# Patient Record
Sex: Female | Born: 1995 | Race: Black or African American | Hispanic: No | Marital: Single | Smoking: Current some day smoker
Health system: Southern US, Community
[De-identification: ages and names within clinical notes are randomized; demographics above are authoritative.]

## PROBLEM LIST (undated history)

## (undated) DIAGNOSIS — K8020 Calculus of gallbladder without cholecystitis without obstruction: Secondary | ICD-10-CM

## (undated) DIAGNOSIS — E079 Disorder of thyroid, unspecified: Secondary | ICD-10-CM

## (undated) DIAGNOSIS — I10 Essential (primary) hypertension: Secondary | ICD-10-CM

## (undated) HISTORY — PX: BREAST SURGERY: SHX581

## (undated) HISTORY — DX: Disorder of thyroid, unspecified: E07.9

## (undated) HISTORY — DX: Essential (primary) hypertension: I10

---

## 2020-01-14 ENCOUNTER — Encounter (HOSPITAL_COMMUNITY): Payer: Self-pay | Admitting: *Deleted

## 2020-01-14 ENCOUNTER — Other Ambulatory Visit: Payer: Self-pay

## 2020-01-14 ENCOUNTER — Ambulatory Visit: Payer: Self-pay

## 2020-01-14 ENCOUNTER — Emergency Department (HOSPITAL_COMMUNITY)
Admission: EM | Admit: 2020-01-14 | Discharge: 2020-01-14 | Disposition: A | Discharge status: Home / Self Care | Payer: 59 | Attending: Emergency Medicine | Admitting: Emergency Medicine

## 2020-01-14 DIAGNOSIS — I10 Essential (primary) hypertension: Secondary | ICD-10-CM | POA: Diagnosis not present

## 2020-01-14 DIAGNOSIS — R1013 Epigastric pain: Secondary | ICD-10-CM | POA: Diagnosis present

## 2020-01-14 DIAGNOSIS — R1011 Right upper quadrant pain: Secondary | ICD-10-CM | POA: Diagnosis not present

## 2020-01-14 LAB — CBC
HCT: 38.6 % (ref 36.0–46.0)
Hemoglobin: 12.1 g/dL (ref 12.0–15.0)
MCH: 25.2 pg — ABNORMAL LOW (ref 26.0–34.0)
MCHC: 31.3 g/dL (ref 30.0–36.0)
MCV: 80.2 fL (ref 80.0–100.0)
Platelets: 429 10*3/uL — ABNORMAL HIGH (ref 150–400)
RBC: 4.81 MIL/uL (ref 3.87–5.11)
RDW: 15.3 % (ref 11.5–15.5)
WBC: 9.1 10*3/uL (ref 4.0–10.5)
nRBC: 0 % (ref 0.0–0.2)

## 2020-01-14 LAB — URINALYSIS, ROUTINE W REFLEX MICROSCOPIC
Bilirubin Urine: NEGATIVE
Glucose, UA: NEGATIVE mg/dL
Hgb urine dipstick: NEGATIVE
Ketones, ur: 5 mg/dL — AB
Leukocytes,Ua: NEGATIVE
Nitrite: NEGATIVE
Protein, ur: NEGATIVE mg/dL
Specific Gravity, Urine: 1.024 (ref 1.005–1.030)
pH: 5 (ref 5.0–8.0)

## 2020-01-14 LAB — COMPREHENSIVE METABOLIC PANEL
ALT: 40 U/L (ref 0–44)
AST: 31 U/L (ref 15–41)
Albumin: 4.1 g/dL (ref 3.5–5.0)
Alkaline Phosphatase: 53 U/L (ref 38–126)
Anion gap: 12 (ref 5–15)
BUN: 9 mg/dL (ref 6–20)
CO2: 22 mmol/L (ref 22–32)
Calcium: 9.5 mg/dL (ref 8.9–10.3)
Chloride: 104 mmol/L (ref 98–111)
Creatinine, Ser: 0.82 mg/dL (ref 0.44–1.00)
GFR calc Af Amer: >60 mL/min (ref >60)
GFR calc non Af Amer: >60 mL/min (ref >60)
Glucose, Bld: 102 mg/dL — ABNORMAL HIGH (ref 70–99)
Potassium: 3.6 mmol/L (ref 3.5–5.1)
Sodium: 138 mmol/L (ref 135–145)
Total Bilirubin: 0.7 mg/dL (ref 0.3–1.2)
Total Protein: 7.5 g/dL (ref 6.5–8.1)

## 2020-01-14 LAB — I-STAT BETA HCG BLOOD, ED (MC, WL, AP ONLY): I-stat hCG, quantitative: <5 m[IU]/mL (ref <5)

## 2020-01-14 LAB — LIPASE, BLOOD: Lipase: 20 U/L (ref 11–51)

## 2020-01-14 NOTE — Discharge Instructions (Signed)
Your lab work today is normal.  Gallbladder and liver enzymes are normal.  Follow-up with your primary care doctor for ultrasound of your gallbladder.  Continue Tylenol Motrin for pain as needed.  Please return if symptoms worsen or fever develops.

## 2020-01-14 NOTE — ED Provider Notes (Signed)
[facility] EMERGENCY DEPARTMENT Provider Note   CSN: [id number] Arrival date & time: 01/14/20  3810     History Chief Complaint  Patient presents with  . Abdominal Pain    Mercedes Mcgee is a 24 y.o. female.  The history is provided by the patient.  Abdominal Pain Pain location:  Epigastric and RUQ Pain quality: aching   Pain radiates to:  Does not radiate Pain severity:  Mild Onset quality:  Gradual Timing:  Intermittent Progression:  Waxing and waning Chronicity:  New Context: eating   Relieved by:  Nothing Worsened by:  Nothing Associated symptoms: constipation   Associated symptoms: no chest pain, no chills, no cough, no diarrhea, no dysuria, no fever, no hematuria, no melena, no nausea, no shortness of breath, no sore throat, no vaginal bleeding, no vaginal discharge and no vomiting   Risk factors: no alcohol abuse and has not had multiple surgeries        Past Medical History:  Diagnosis Date  . Hypertension   . Thyroid disease     There are no problems to display for this patient.   Past Surgical History:  Procedure Laterality Date  . BREAST SURGERY       OB History   No obstetric history on file.     No family history on file.  Social History   Tobacco Use  . Smoking status: Not on file  Substance Use Topics  . Alcohol use: Not on file  . Drug use: Not on file    Home Medications Prior to Admission medications   Not on File    Allergies    Patient has no known allergies.  Review of Systems   Review of Systems  Constitutional: Negative for chills and fever.  HENT: Negative for ear pain and sore throat.   Eyes: Negative for pain and visual disturbance.  Respiratory: Negative for cough and shortness of breath.   Cardiovascular: Negative for chest pain and palpitations.  Gastrointestinal: Positive for abdominal pain and constipation. Negative for diarrhea, melena, nausea and vomiting.  Genitourinary: Negative for  dysuria, hematuria, vaginal bleeding and vaginal discharge.  Musculoskeletal: Negative for arthralgias and back pain.  Skin: Negative for color change and rash.  Neurological: Negative for seizures and syncope.  All other systems reviewed and are negative.   Physical Exam Updated Vital Signs  ED Triage Vitals  Enc Vitals Group     BP 01/14/20 0610 118/88     Pulse Rate 01/14/20 0610 88     Resp 01/14/20 0610 16     Temp 01/14/20 0610 98.2 F (36.8 C)     Temp Source 01/14/20 0610 Oral     SpO2 01/14/20 0610 100 %     Weight 01/14/20 0610 284 lb (128.8 kg)     Height 01/14/20 0610 5\' 4"  (1.626 m)     Head Circumference --      Peak Flow --      Pain Score 01/14/20 0614 7     Pain Loc --      Pain Edu? --      Excl. in GC? --     Physical Exam Vitals and nursing note reviewed.  Constitutional:      General: She is not in acute distress.    Appearance: She is well-developed. She is not ill-appearing.  HENT:     Head: Normocephalic and atraumatic.     Mouth/Throat:     Mouth: Mucous membranes are moist.  Eyes:  Extraocular Movements: Extraocular movements intact.     Conjunctiva/sclera: Conjunctivae normal.     Pupils: Pupils are equal, round, and reactive to light.  Cardiovascular:     Rate and Rhythm: Normal rate and regular rhythm.     Heart sounds: Normal heart sounds. No murmur heard.   Pulmonary:     Effort: Pulmonary effort is normal. No respiratory distress.     Breath sounds: Normal breath sounds.  Abdominal:     General: Abdomen is flat.     Palpations: Abdomen is soft.     Tenderness: There is abdominal tenderness in the right upper quadrant and epigastric area. There is no guarding or rebound. Negative signs include Murphy's sign, Rovsing's sign and McBurney's sign.     Hernia: No hernia is present.  Musculoskeletal:     Cervical back: Neck supple.  Skin:    General: Skin is warm and dry.     Capillary Refill: Capillary refill takes less than 2  seconds.  Neurological:     General: No focal deficit present.     Mental Status: She is alert.  Psychiatric:        Mood and Affect: Mood normal.     ED Results / Procedures / Treatments   Labs (all labs ordered are listed, but only abnormal results are displayed) Labs Reviewed  COMPREHENSIVE METABOLIC PANEL - Abnormal; Notable for the following components:      Result Value   Glucose, Bld 102 (*)    All other components within normal limits  CBC - Abnormal; Notable for the following components:   MCH 25.2 (*)    Platelets 429 (*)    All other components within normal limits  URINALYSIS, ROUTINE W REFLEX MICROSCOPIC - Abnormal; Notable for the following components:   APPearance HAZY (*)    Ketones, ur 5 (*)    All other components within normal limits  LIPASE, BLOOD  I-STAT BETA HCG BLOOD, ED ([facility], [facility], [facility] ONLY)    EKG None  Radiology No results found.  Procedures Procedures (including critical care time)  Medications Ordered in ED Medications - No data to display  ED Course  I have reviewed the triage vital signs and the nursing notes.  Pertinent labs & imaging results that were available during my care of the patient were reviewed by me and considered in my medical decision making (see chart for details).    MDM Rules/Calculators/A&P                          Mercedes Mcgee is a 24 year old female with history of hypertension who presents to the ED with right upper abdominal pain on and off for the past week or so.  Normal vitals, no fever.  Denies any nausea, vomiting.  Has been constipated.  Feels like possibly gas pain.  May be worse with eating.  Denies any hematuria or history of kidney stones.  No vaginal discharge or bleeding.  Pregnancy test negative.  Has some tenderness in the epigastric and right upper quadrant on abdominal exam.  No peritonitis.  No obvious Murphy sign.  Suspect likely gastritis versus hepatobiliary process such as gallstone/biliary colic,  less likely cholecystitis given normal vitals, no white count.  Could be constipated/gas pains.  Will await CMP, lipase.  Anticipate follow-up with PCP if lab work is unremarkable.  Gallbladder and liver enzymes normal.  Lipase normal.  Doubt pancreatitis.  Doubt cholecystitis.  Could be biliary colic but  overall patient is comfortable.  Given return precautions.  Recommend follow-up with primary care doctor and discharged from the ED in good condition.  This chart was dictated using voice recognition software.  Despite best efforts to proofread,  errors can occur which can change the documentation meaning.    Final Clinical Impression(s) / ED Diagnoses Final diagnoses:  Right upper quadrant abdominal pain    Rx / DC Orders ED Discharge Orders    None       Virgina Norfolk, DO 01/14/20 9480

## 2020-01-14 NOTE — ED Triage Notes (Signed)
To ED for eval of right upper abd pain for the past week. States pain is worse when she is laying or sitting. Pain is relieved when moving. States she is constipated but is passing gas. Also burping more than her normal. No nausea or vomiting. No OTC meds used.

## 2020-01-18 ENCOUNTER — Encounter (HOSPITAL_BASED_OUTPATIENT_CLINIC_OR_DEPARTMENT_OTHER): Payer: Self-pay | Admitting: Emergency Medicine

## 2020-01-18 ENCOUNTER — Other Ambulatory Visit: Payer: Self-pay

## 2020-01-18 ENCOUNTER — Emergency Department (HOSPITAL_BASED_OUTPATIENT_CLINIC_OR_DEPARTMENT_OTHER): Payer: 59

## 2020-01-18 DIAGNOSIS — K8050 Calculus of bile duct without cholangitis or cholecystitis without obstruction: Secondary | ICD-10-CM | POA: Insufficient documentation

## 2020-01-18 DIAGNOSIS — K8020 Calculus of gallbladder without cholecystitis without obstruction: Secondary | ICD-10-CM | POA: Diagnosis not present

## 2020-01-18 DIAGNOSIS — F1210 Cannabis abuse, uncomplicated: Secondary | ICD-10-CM | POA: Diagnosis not present

## 2020-01-18 DIAGNOSIS — I10 Essential (primary) hypertension: Secondary | ICD-10-CM | POA: Insufficient documentation

## 2020-01-18 DIAGNOSIS — R1011 Right upper quadrant pain: Secondary | ICD-10-CM | POA: Diagnosis present

## 2020-01-18 LAB — URINALYSIS, ROUTINE W REFLEX MICROSCOPIC
Bilirubin Urine: NEGATIVE
Glucose, UA: NEGATIVE mg/dL
Ketones, ur: 15 mg/dL — AB
Leukocytes,Ua: NEGATIVE
Nitrite: NEGATIVE
Protein, ur: NEGATIVE mg/dL
Specific Gravity, Urine: 1.025 (ref 1.005–1.030)
pH: 6 (ref 5.0–8.0)

## 2020-01-18 LAB — COMPREHENSIVE METABOLIC PANEL
ALT: 21 U/L (ref 0–44)
AST: 16 U/L (ref 15–41)
Albumin: 4.2 g/dL (ref 3.5–5.0)
Alkaline Phosphatase: 50 U/L (ref 38–126)
Anion gap: 10 (ref 5–15)
BUN: 9 mg/dL (ref 6–20)
CO2: 22 mmol/L (ref 22–32)
Calcium: 9.3 mg/dL (ref 8.9–10.3)
Chloride: 104 mmol/L (ref 98–111)
Creatinine, Ser: 0.71 mg/dL (ref 0.44–1.00)
GFR calc Af Amer: >60 mL/min (ref >60)
GFR calc non Af Amer: >60 mL/min (ref >60)
Glucose, Bld: 94 mg/dL (ref 70–99)
Potassium: 3.8 mmol/L (ref 3.5–5.1)
Sodium: 136 mmol/L (ref 135–145)
Total Bilirubin: 0.3 mg/dL (ref 0.3–1.2)
Total Protein: 7.6 g/dL (ref 6.5–8.1)

## 2020-01-18 LAB — CBC
HCT: 38.1 % (ref 36.0–46.0)
Hemoglobin: 12.5 g/dL (ref 12.0–15.0)
MCH: 25.9 pg — ABNORMAL LOW (ref 26.0–34.0)
MCHC: 32.8 g/dL (ref 30.0–36.0)
MCV: 78.9 fL — ABNORMAL LOW (ref 80.0–100.0)
Platelets: 427 10*3/uL — ABNORMAL HIGH (ref 150–400)
RBC: 4.83 MIL/uL (ref 3.87–5.11)
RDW: 15.3 % (ref 11.5–15.5)
WBC: 10 10*3/uL (ref 4.0–10.5)
nRBC: 0 % (ref 0.0–0.2)

## 2020-01-18 LAB — URINALYSIS, MICROSCOPIC (REFLEX)

## 2020-01-18 LAB — PREGNANCY, URINE: Preg Test, Ur: NEGATIVE

## 2020-01-18 LAB — LIPASE, BLOOD: Lipase: 22 U/L (ref 11–51)

## 2020-01-18 NOTE — ED Triage Notes (Signed)
Reports RUQ pain that radiates to back and constipation x 2.5 weeks. Pt was seen in ED on 8/31 for same and sent home with Miralax. States she has taken the miralax "not daily". LBM 3 days ago. Pt reports pain is intermittent and describes it as sharp. Denies fever, n/v.

## 2020-01-18 NOTE — ED Notes (Signed)
Imaging after u preg results 

## 2020-01-19 ENCOUNTER — Emergency Department (HOSPITAL_BASED_OUTPATIENT_CLINIC_OR_DEPARTMENT_OTHER): Payer: 59

## 2020-01-19 ENCOUNTER — Encounter (HOSPITAL_BASED_OUTPATIENT_CLINIC_OR_DEPARTMENT_OTHER): Payer: Self-pay

## 2020-01-19 ENCOUNTER — Emergency Department (HOSPITAL_BASED_OUTPATIENT_CLINIC_OR_DEPARTMENT_OTHER)
Admission: EM | Admit: 2020-01-19 | Discharge: 2020-01-19 | Disposition: A | Discharge status: Home / Self Care | Payer: 59 | Attending: Emergency Medicine | Admitting: Emergency Medicine

## 2020-01-19 DIAGNOSIS — K8020 Calculus of gallbladder without cholecystitis without obstruction: Secondary | ICD-10-CM

## 2020-01-19 DIAGNOSIS — R109 Unspecified abdominal pain: Secondary | ICD-10-CM

## 2020-01-19 DIAGNOSIS — K8050 Calculus of bile duct without cholangitis or cholecystitis without obstruction: Secondary | ICD-10-CM

## 2020-01-19 MED ORDER — ONDANSETRON 8 MG PO TBDP: 8.0000 mg | ORAL_TABLET | Freq: Three times a day (TID) | ORAL | 1 refills | Status: AC | PRN

## 2020-01-19 MED ORDER — ONDANSETRON 8 MG PO TBDP: 8.0000 mg | ORAL_TABLET | Freq: Three times a day (TID) | ORAL | 1 refills | Status: DC | PRN

## 2020-01-19 MED ORDER — IOHEXOL 300 MG/ML  SOLN
100.0000 mL | Freq: Once | INTRAMUSCULAR | Status: AC | PRN
  Administered 2020-01-19: 100 mL via INTRAVENOUS

## 2020-01-19 MED ORDER — HYDROCODONE-ACETAMINOPHEN 5-325 MG PO TABS
1.0000 | ORAL_TABLET | ORAL | 0 refills | Status: AC | PRN
Start: 2020-01-19 — End: ?

## 2020-01-19 MED ORDER — ONDANSETRON HCL 4 MG/2ML IJ SOLN
4.0000 mg | Freq: Once | INTRAMUSCULAR | Status: AC
  Administered 2020-01-19: 4 mg via INTRAVENOUS
  Filled 2020-01-19: qty 2

## 2020-01-19 MED ORDER — HYDROCODONE-ACETAMINOPHEN 5-325 MG PO TABS
1.0000 | ORAL_TABLET | ORAL | 0 refills | Status: DC | PRN
Start: 2020-01-19 — End: 2020-01-19

## 2020-01-19 NOTE — ED Provider Notes (Signed)
[facility] Provider Note: Lowella Dell, MD, FACEP  CSN: [id number] MRN: [id number] ARRIVAL: 01/18/20 at 2119 ROOM: [facility]01/[facility]   CHIEF COMPLAINT  Abdominal Pain   HISTORY OF PRESENT ILLNESS  01/19/20 3:17 AM Mercedes Mcgee is a 24 y.o. female with 2-1/2 weeks of right upper quadrant abdominal pain.  The pain is also felt in her right lower ribs and her right flank.  She rates it as a 7 out of 10 and describes it as sharp.  It is worse with movement or palpation.  It is not really worse with eating although she feels full all the time.  She has not had nausea, vomiting or diarrhea but has been constipated.  She was prescribed MiraLAX on 831 but has not taken it every day.  The pain waxes and wanes but has never completely abated.   Past Medical History:  Diagnosis Date  . Hypertension   . Thyroid disease     Past Surgical History:  Procedure Laterality Date  . BREAST SURGERY      No family history on file.  Social History   Tobacco Use  . Smoking status: Never Smoker  . Smokeless tobacco: Never Used  Substance Use Topics  . Alcohol use: Yes    Comment: occasional  . Drug use: Yes    Types: Marijuana    Prior to Admission medications   Not on File    Allergies Patient has no known allergies.   REVIEW OF SYSTEMS  Negative except as noted here or in the History of Present Illness.   PHYSICAL EXAMINATION  Initial Vital Signs Blood pressure 129/79, pulse 82, temperature 98.3 F (36.8 C), temperature source Oral, resp. rate 18, height 5\' 4"  (1.626 m), weight 128.8 kg, last menstrual period 01/05/2020, SpO2 100 %.  Examination General: Well-developed, well-nourished female in no acute distress; appearance consistent with age of record HENT: normocephalic; atraumatic Eyes: pupils equal, round and reactive to light; extraocular muscles intact Neck: supple Heart: regular rate and rhythm Lungs: clear to auscultation bilaterally Chest: Right lower  anterolateral rib tenderness Abdomen: soft; nondistended; right upper quadrant tenderness; bowel sounds present Extremities: No deformity; full range of motion; pulses normal Neurologic: Awake, alert and oriented; motor function intact in all extremities and symmetric; no facial droop Skin: Warm and dry Psychiatric: Normal mood and affect   RESULTS  Summary of this visit's results, reviewed and interpreted by myself:   EKG Interpretation  Date/Time:    Ventricular Rate:    PR Interval:    QRS Duration:   QT Interval:    QTC Calculation:   R Axis:     Text Interpretation:        Laboratory Studies: Results for orders placed or performed during the hospital encounter of 01/19/20 (from the past 24 hour(s))  Lipase, blood     Status: None   Collection Time: 01/18/20 10:00 PM  Result Value Ref Range   Lipase 22 11 - 51 U/L  Comprehensive metabolic panel     Status: None   Collection Time: 01/18/20 10:00 PM  Result Value Ref Range   Sodium 136 135 - 145 mmol/L   Potassium 3.8 3.5 - 5.1 mmol/L   Chloride 104 98 - 111 mmol/L   CO2 22 22 - 32 mmol/L   Glucose, Bld 94 70 - 99 mg/dL   BUN 9 6 - 20 mg/dL   Creatinine, Ser 03/19/20 0.44 - 1.00 mg/dL   Calcium 9.3 8.9 - 5.09 mg/dL  Total Protein 7.6 6.5 - 8.1 g/dL   Albumin 4.2 3.5 - 5.0 g/dL   AST 16 15 - 41 U/L   ALT 21 0 - 44 U/L   Alkaline Phosphatase 50 38 - 126 U/L   Total Bilirubin 0.3 0.3 - 1.2 mg/dL   GFR calc non Af Amer >60 >60 mL/min   GFR calc Af Amer >60 >60 mL/min   Anion gap 10 5 - 15  CBC     Status: Abnormal   Collection Time: 01/18/20 10:00 PM  Result Value Ref Range   WBC 10.0 4.0 - 10.5 K/uL   RBC 4.83 3.87 - 5.11 MIL/uL   Hemoglobin 12.5 12.0 - 15.0 g/dL   HCT 42.3 36 - 46 %   MCV 78.9 (L) 80.0 - 100.0 fL   MCH 25.9 (L) 26.0 - 34.0 pg   MCHC 32.8 30.0 - 36.0 g/dL   RDW 53.6 14.4 - 31.5 %   Platelets 427 (H) 150 - 400 K/uL   nRBC 0.0 0.0 - 0.2 %  Urinalysis, Routine w reflex microscopic Urine, Clean  Catch     Status: Abnormal   Collection Time: 01/18/20 11:00 PM  Result Value Ref Range   Color, Urine YELLOW YELLOW   APPearance HAZY (A) CLEAR   Specific Gravity, Urine 1.025 1.005 - 1.030   pH 6.0 5.0 - 8.0   Glucose, UA NEGATIVE NEGATIVE mg/dL   Hgb urine dipstick TRACE (A) NEGATIVE   Bilirubin Urine NEGATIVE NEGATIVE   Ketones, ur 15 (A) NEGATIVE mg/dL   Protein, ur NEGATIVE NEGATIVE mg/dL   Nitrite NEGATIVE NEGATIVE   Leukocytes,Ua NEGATIVE NEGATIVE  Pregnancy, urine     Status: None   Collection Time: 01/18/20 11:00 PM  Result Value Ref Range   Preg Test, Ur NEGATIVE NEGATIVE  Urinalysis, Microscopic (reflex)     Status: Abnormal   Collection Time: 01/18/20 11:00 PM  Result Value Ref Range   RBC / HPF 0-5 0 - 5 RBC/hpf   WBC, UA 0-5 0 - 5 WBC/hpf   Bacteria, UA MANY (A) NONE SEEN   Squamous Epithelial / LPF 0-5 0 - 5   Mucus PRESENT    Ca Oxalate Crys, UA PRESENT    Imaging Studies: CT ABDOMEN PELVIS W CONTRAST  Result Date: 01/19/2020 CLINICAL DATA:  Right upper quadrant abdominal pain, constipation EXAM: CT ABDOMEN AND PELVIS WITH CONTRAST TECHNIQUE: Multidetector CT imaging of the abdomen and pelvis was performed using the standard protocol following bolus administration of intravenous contrast. CONTRAST:  OMNIPAQUE IOHEXOL 300 MG/ML  SOLN COMPARISON:  None. FINDINGS: Lower chest: Lung bases are clear. Hepatobiliary: Liver is within normal limits. Contracted gallbladder with gallstones (coronal image 31), without associated inflammatory changes. No intrahepatic or extrahepatic ductal dilatation. Pancreas: Within normal limits. Spleen: Within normal limits. Adrenals/Urinary Tract: Adrenal glands are within normal limits. Kidneys are within normal limits.  No hydronephrosis. Bladder is within normal limits. Stomach/Bowel: Stomach is within normal limits. No evidence of bowel obstruction. Normal appendix (series 2/image 65). No colonic wall thickening or inflammatory  changes. Normal colonic stool burden. Vascular/Lymphatic: No evidence of abdominal aortic aneurysm. No suspicious abdominopelvic lymphadenopathy. Reproductive: Uterus is within normal limits. Bilateral ovaries are within normal limits, including a dominant 2.4 cm right ovarian follicle, physiologic. Other: No abdominopelvic ascites. Musculoskeletal: Visualized osseous structures are within normal limits. IMPRESSION: Cholelithiasis, without associated inflammatory changes. No evidence of bowel obstruction.  Normal appendix. Normal colonic stool burden. Electronically Signed   By: Charline Bills  M.D.   On: 01/19/2020 04:38   DG Abd 2 Views  Result Date: 01/18/2020 CLINICAL DATA:  Right upper quadrant pain EXAM: ABDOMEN - 2 VIEW COMPARISON:  None. FINDINGS: The bowel gas pattern is normal. There is no evidence of free air. No radio-opaque calculi or other significant radiographic abnormality is seen. IMPRESSION: Negative. Electronically Signed   By: Deatra Robinson M.D.   On: 01/18/2020 23:53    ED COURSE and MDM  Nursing notes, initial and subsequent vitals signs, including pulse oximetry, reviewed and interpreted by myself.  Vitals:   01/18/20 2149 01/18/20 2150 01/19/20 0138  BP: 132/89  129/79  Pulse: 82    Resp: 20  18  Temp: 99 F (37.2 C)  98.3 F (36.8 C)  TempSrc: Oral  Oral  SpO2: 99%  100%  Weight:  128.8 kg   Height:  5\' 4"  (1.626 m)    Medications  ondansetron (ZOFRAN) injection 4 mg (4 mg Intravenous Given 01/19/20 0345)  iohexol (OMNIPAQUE) 300 MG/ML solution 100 mL (100 mLs Intravenous Contrast Given 01/19/20 0407)   CT consistent with cholelithiasis and clinical presentation consistent with biliary colic.  No signs of acute cholecystitis.  We will treat patient symptoms and refer to [facility] surgery.   PROCEDURES  Procedures   ED DIAGNOSES     ICD-10-CM   1. Biliary colic  K80.50   2. Abdominal pain  R10.9 DG Abd 2 Views    DG Abd 2 Views  3. Calculus of  gallbladder without cholecystitis without obstruction  K80.20        Tya Haughey, MD 01/19/20 [phone]

## 2020-01-27 ENCOUNTER — Other Ambulatory Visit: Payer: Self-pay

## 2020-01-27 ENCOUNTER — Emergency Department (HOSPITAL_COMMUNITY)
Admission: EM | Admit: 2020-01-27 | Discharge: 2020-01-28 | Disposition: A | Discharge status: Home / Self Care | Payer: 59 | Attending: Emergency Medicine | Admitting: Emergency Medicine

## 2020-01-27 ENCOUNTER — Encounter (HOSPITAL_COMMUNITY): Payer: Self-pay | Admitting: Emergency Medicine

## 2020-01-27 DIAGNOSIS — R109 Unspecified abdominal pain: Secondary | ICD-10-CM | POA: Diagnosis not present

## 2020-01-27 DIAGNOSIS — R0602 Shortness of breath: Secondary | ICD-10-CM | POA: Diagnosis not present

## 2020-01-27 DIAGNOSIS — R072 Precordial pain: Secondary | ICD-10-CM | POA: Diagnosis not present

## 2020-01-27 DIAGNOSIS — Z5321 Procedure and treatment not carried out due to patient leaving prior to being seen by health care provider: Secondary | ICD-10-CM | POA: Diagnosis not present

## 2020-01-27 LAB — COMPREHENSIVE METABOLIC PANEL
ALT: 16 U/L (ref 0–44)
AST: 21 U/L (ref 15–41)
Albumin: 4.2 g/dL (ref 3.5–5.0)
Alkaline Phosphatase: 59 U/L (ref 38–126)
Anion gap: 10 (ref 5–15)
BUN: 9 mg/dL (ref 6–20)
CO2: 25 mmol/L (ref 22–32)
Calcium: 10.2 mg/dL (ref 8.9–10.3)
Chloride: 105 mmol/L (ref 98–111)
Creatinine, Ser: 0.97 mg/dL (ref 0.44–1.00)
GFR calc Af Amer: >60 mL/min (ref >60)
GFR calc non Af Amer: >60 mL/min (ref >60)
Glucose, Bld: 98 mg/dL (ref 70–99)
Potassium: 4.1 mmol/L (ref 3.5–5.1)
Sodium: 140 mmol/L (ref 135–145)
Total Bilirubin: 0.6 mg/dL (ref 0.3–1.2)
Total Protein: 8.2 g/dL — ABNORMAL HIGH (ref 6.5–8.1)

## 2020-01-27 LAB — CBC
HCT: 42.1 % (ref 36.0–46.0)
Hemoglobin: 13.2 g/dL (ref 12.0–15.0)
MCH: 25 pg — ABNORMAL LOW (ref 26.0–34.0)
MCHC: 31.4 g/dL (ref 30.0–36.0)
MCV: 79.7 fL — ABNORMAL LOW (ref 80.0–100.0)
Platelets: 403 10*3/uL — ABNORMAL HIGH (ref 150–400)
RBC: 5.28 MIL/uL — ABNORMAL HIGH (ref 3.87–5.11)
RDW: 15.3 % (ref 11.5–15.5)
WBC: 11.5 10*3/uL — ABNORMAL HIGH (ref 4.0–10.5)
nRBC: 0 % (ref 0.0–0.2)

## 2020-01-27 LAB — LIPASE, BLOOD: Lipase: 22 U/L (ref 11–51)

## 2020-01-27 LAB — I-STAT BETA HCG BLOOD, ED (MC, WL, AP ONLY): I-stat hCG, quantitative: <5 m[IU]/mL (ref <5)

## 2020-01-27 LAB — TROPONIN I (HIGH SENSITIVITY): Troponin I (High Sensitivity): 3 ng/L (ref <18)

## 2020-01-27 NOTE — ED Notes (Signed)
Pt threw away her pt labels and stated she was leaving

## 2020-01-27 NOTE — ED Triage Notes (Signed)
Pt reports abdominal pain X3 weeks.  Pt reports right sided quad pain that goes to her back.  She said the pain now makes her breasts hurt and substernal chest pain.  Pt states she feels SOB and like she is gasping for air.

## 2020-02-22 ENCOUNTER — Other Ambulatory Visit: Payer: Self-pay

## 2020-02-22 ENCOUNTER — Encounter (HOSPITAL_BASED_OUTPATIENT_CLINIC_OR_DEPARTMENT_OTHER): Payer: Self-pay | Admitting: Emergency Medicine

## 2020-02-22 ENCOUNTER — Emergency Department (HOSPITAL_BASED_OUTPATIENT_CLINIC_OR_DEPARTMENT_OTHER)
Admission: EM | Admit: 2020-02-22 | Discharge: 2020-02-23 | Disposition: A | Discharge status: Home / Self Care | Payer: Managed Care, Other (non HMO) | Attending: Emergency Medicine | Admitting: Emergency Medicine

## 2020-02-22 DIAGNOSIS — I10 Essential (primary) hypertension: Secondary | ICD-10-CM | POA: Diagnosis not present

## 2020-02-22 DIAGNOSIS — K645 Perianal venous thrombosis: Secondary | ICD-10-CM | POA: Insufficient documentation

## 2020-02-22 DIAGNOSIS — K602 Anal fissure, unspecified: Secondary | ICD-10-CM

## 2020-02-22 DIAGNOSIS — F17200 Nicotine dependence, unspecified, uncomplicated: Secondary | ICD-10-CM | POA: Diagnosis not present

## 2020-02-22 DIAGNOSIS — R319 Hematuria, unspecified: Secondary | ICD-10-CM | POA: Diagnosis present

## 2020-02-22 DIAGNOSIS — K649 Unspecified hemorrhoids: Secondary | ICD-10-CM

## 2020-02-22 HISTORY — DX: Calculus of gallbladder without cholecystitis without obstruction: K80.20

## 2020-02-22 MED ORDER — ACETAMINOPHEN 500 MG PO TABS
1000.0000 mg | ORAL_TABLET | Freq: Once | ORAL | Status: DC
  Filled 2020-02-22: qty 2

## 2020-02-22 NOTE — ED Notes (Signed)
RN at bedside during rectal exam. Pt tolerated well. Hemoccult sent to lab.

## 2020-02-22 NOTE — ED Triage Notes (Signed)
Reports blood in stool today - states she was experiencing orange stools. States she's been constipated and today was her first bowel movement.

## 2020-02-23 ENCOUNTER — Encounter (HOSPITAL_BASED_OUTPATIENT_CLINIC_OR_DEPARTMENT_OTHER): Payer: Self-pay | Admitting: Emergency Medicine

## 2020-02-23 LAB — OCCULT BLOOD X 1 CARD TO LAB, STOOL: Fecal Occult Bld: NEGATIVE

## 2020-02-23 NOTE — ED Provider Notes (Signed)
[facility] EMERGENCY DEPARTMENT Provider Note   CSN: [id number] Arrival date & time: 02/22/20  2326     History Chief Complaint  Patient presents with  . blood in stool    Mercedes Mcgee is a 24 y.o. female.  The history is provided by the patient.  Rectal Bleeding Quality:  Bright red Amount:  Scant Timing:  Sporadic Chronicity:  New Context: constipation and defecation   Relieved by:  Nothing Worsened by:  Nothing Ineffective treatments:  None tried Associated symptoms: no abdominal pain, no dizziness, no fever and no loss of consciousness   Risk factors: no anticoagulant use, no hx of IBD and no NSAID use   Patient with constipation x weeks and now with bleeding with defecation.  None in the bowel.  No f/c/r.  No abdominal pain.       Past Medical History:  Diagnosis Date  . Gall stones   . Hypertension   . Thyroid disease     There are no problems to display for this patient.   Past Surgical History:  Procedure Laterality Date  . BREAST SURGERY       OB History   No obstetric history on file.     History reviewed. No pertinent family history.  Social History   Tobacco Use  . Smoking status: Current Some Day Smoker  . Smokeless tobacco: Never Used  Substance Use Topics  . Alcohol use: Yes    Comment: occasional  . Drug use: Yes    Types: Marijuana    Home Medications Prior to Admission medications   Medication Sig Start Date End Date Taking? Authorizing Provider  HYDROcodone-acetaminophen (NORCO) 5-325 MG tablet Take 1 tablet by mouth every 4 (four) hours as needed for severe pain (may cause constipation). 01/19/20   Molpus, John, MD  ondansetron (ZOFRAN ODT) 8 MG disintegrating tablet Take 1 tablet (8 mg total) by mouth every 8 (eight) hours as needed for nausea or vomiting. 01/19/20   Molpus, Jonny Ruiz, MD    Allergies    Patient has no known allergies.  Review of Systems   Review of Systems  Constitutional: Negative for fever.  HENT:  Negative for dental problem.   Eyes: Negative for visual disturbance.  Respiratory: Negative for chest tightness.   Cardiovascular: Negative for chest pain.  Gastrointestinal: Positive for anal bleeding and hematochezia. Negative for abdominal pain.  Genitourinary: Negative for difficulty urinating.  Musculoskeletal: Negative for arthralgias.  Skin: Negative for rash.  Neurological: Negative for dizziness and loss of consciousness.  All other systems reviewed and are negative.   Physical Exam Updated Vital Signs BP 131/89   Pulse (!) 104   Temp 98.5 F (36.9 C)   Resp 18   LMP 02/07/2020 (Exact Date)   SpO2 100%   Physical Exam Vitals and nursing note reviewed. Exam conducted with a chaperone present.  Constitutional:      General: She is not in acute distress.    Appearance: Normal appearance.  HENT:     Head: Normocephalic and atraumatic.     Nose: Nose normal.  Eyes:     Conjunctiva/sclera: Conjunctivae normal.     Pupils: Pupils are equal, round, and reactive to light.  Cardiovascular:     Rate and Rhythm: Normal rate and regular rhythm.     Pulses: Normal pulses.     Heart sounds: Normal heart sounds.  Pulmonary:     Effort: Pulmonary effort is normal.     Breath sounds: Normal breath sounds.  Abdominal:     General: Abdomen is flat. Bowel sounds are normal.     Palpations: Abdomen is soft.     Tenderness: There is no abdominal tenderness. There is no guarding or rebound.  Genitourinary:    Comments: Anal fissure at six oclock position and small non thrombosed hemorrhoid.  Musculoskeletal:        General: Normal range of motion.     Cervical back: Normal range of motion and neck supple.  Skin:    General: Skin is warm and dry.     Capillary Refill: Capillary refill takes less than 2 seconds.  Neurological:     General: No focal deficit present.     Mental Status: She is alert and oriented to person, place, and time.     Deep Tendon Reflexes: Reflexes  normal.  Psychiatric:        Mood and Affect: Mood normal.        Behavior: Behavior normal.     ED Results / Procedures / Treatments   Labs (all labs ordered are listed, but only abnormal results are displayed) Labs Reviewed - No data to display  EKG None  Radiology No results found.  Procedures Procedures (including critical care time)  Medications Ordered in ED Medications  acetaminophen (TYLENOL) tablet 1,000 mg (has no administration in time range)    ED Course  I have reviewed the triage vital signs and the nursing notes.  Pertinent labs & imaging results that were available during my care of the patient were reviewed by me and considered in my medical decision making (see chart for details).    Anal fissure: Sitz baths, starting miralax daily.  Using baby wipes to wipe instead of toilet paper.  Follow up with PMD for ongoing care.   Mercedes Mcgee was evaluated in Emergency Department on 02/23/2020 for the symptoms described in the history of present illness. She was evaluated in the context of the global COVID-19 pandemic, which necessitated consideration that the patient might be at risk for infection with the SARS-CoV-2 virus that causes COVID-19. Institutional protocols and algorithms that pertain to the evaluation of patients at risk for COVID-19 are in a state of rapid change based on information released by regulatory bodies including the CDC and federal and state organizations. These policies and algorithms were followed during the patient's care in the ED.  Final Clinical Impression(s) / ED Diagnoses Final diagnoses:  Anal fissure  Hemorrhoids, unspecified hemorrhoid type   Return for intractable cough, coughing up blood,fevers >100.4 unrelieved by medication, shortness of breath, intractable vomiting, chest pain, shortness of breath, weakness,numbness, changes in speech, facial asymmetry,abdominal pain, passing out,Inability to tolerate liquids or food,  cough, altered mental status or any concerns. No signs of systemic illness or infection. The patient is nontoxic-appearing on exam and vital signs are within normal limits.   I have reviewed the triage vital signs and the nursing notes. Pertinent labs &imaging results that were available during my care of the patient were reviewed by me and considered in my medical decision making (see chart for details).After history, exam, and medical workup I feel the patient has beenappropriately medically screened and is safe for discharge home. Pertinent diagnoses were discussed with the patient. Patient was given return precautions.   Kieren Adkison, MD 02/23/20 0009

## 2021-06-13 IMAGING — CT CT ABD-PELV W/ CM
2 of 4 series · 17 of 46 positions shown, 19 images · IV contrast (omnipaque)
Comparison: None.

CLINICAL DATA: Right upper quadrant abdominal pain, constipation

EXAM:
CT ABDOMEN AND PELVIS WITH CONTRAST
TECHNIQUE: Multidetector CT imaging of the abdomen and pelvis was performed
using the standard protocol following bolus administration of
intravenous contrast.
CONTRAST:  100mL OMNIPAQUE IOHEXOL 300 MG/ML  SOLN

[Series 2: axial st · axial · 0.68mm/px · z∈[-528,-94]mm · 14 of 95 slices shown, 16 images]
[im 4/95  soft-tissue]
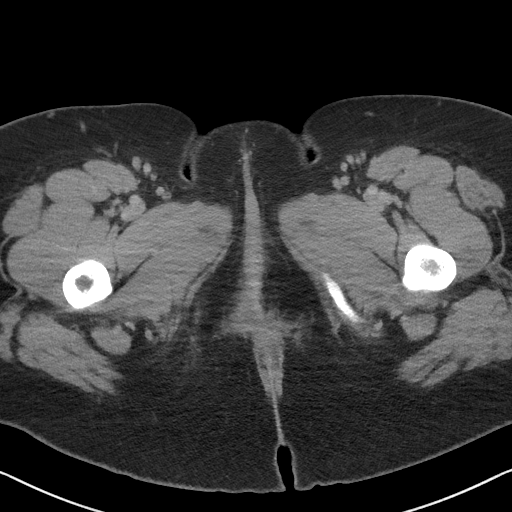
[im 4/95  bone]
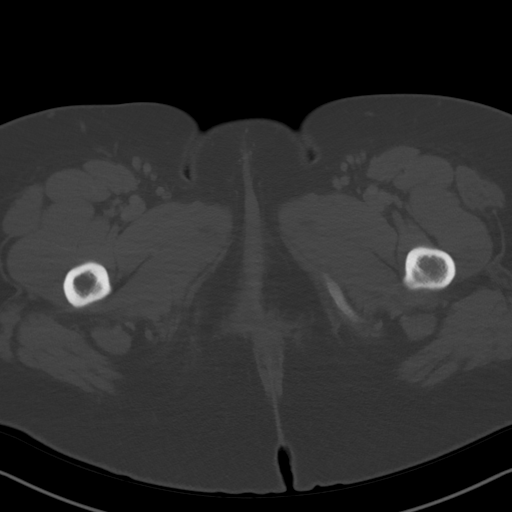
[im 12/95  soft-tissue]
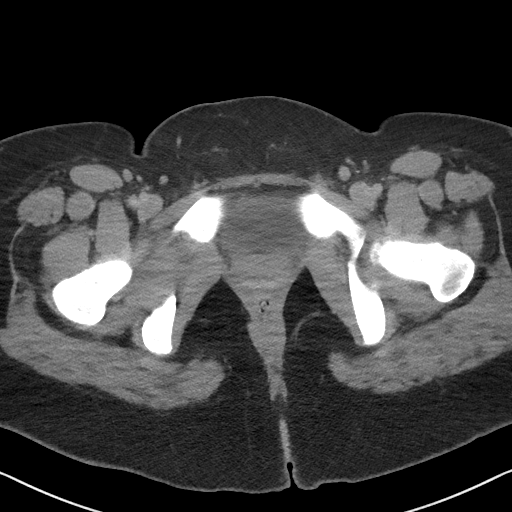
[im 20/95  soft-tissue]
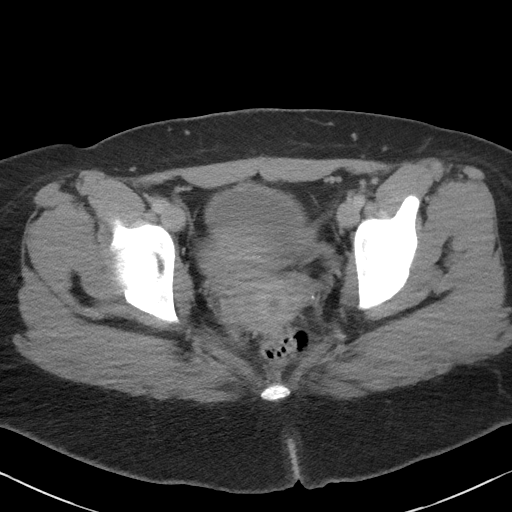
[im 24/95  soft-tissue]
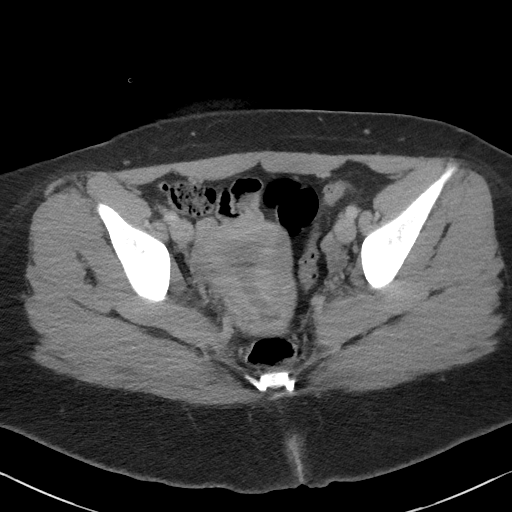
[im 32/95  soft-tissue]
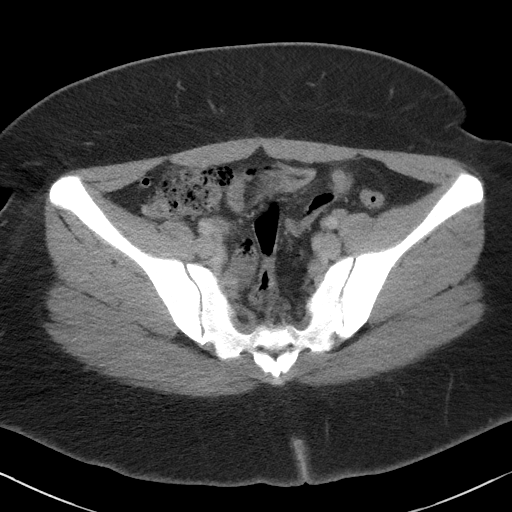
[im 40/95  soft-tissue]
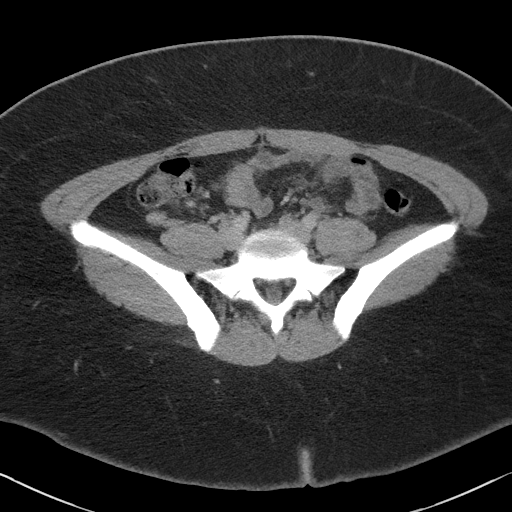
[im 44/95  soft-tissue]
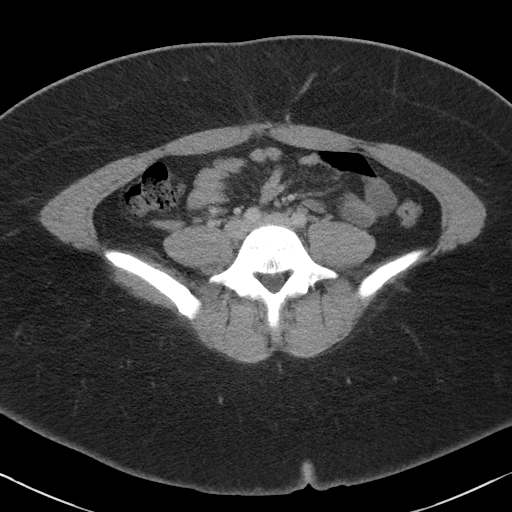
[im 51/95  soft-tissue]
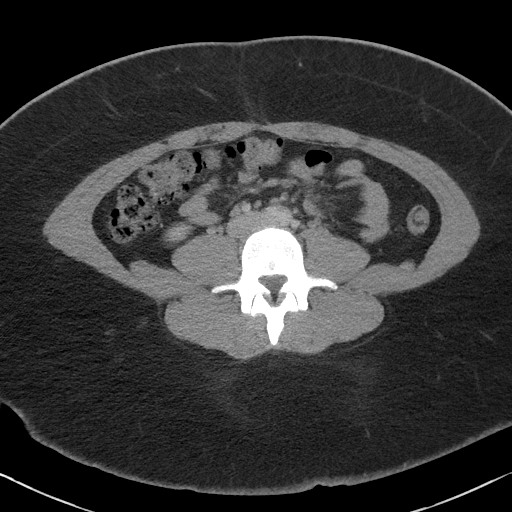
[im 55/95  soft-tissue]
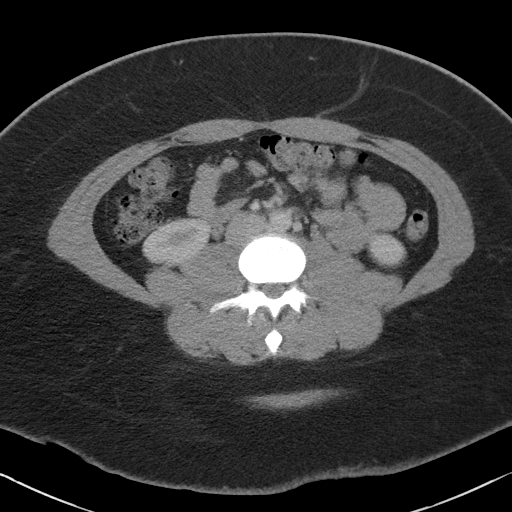
[im 55/95  bone]
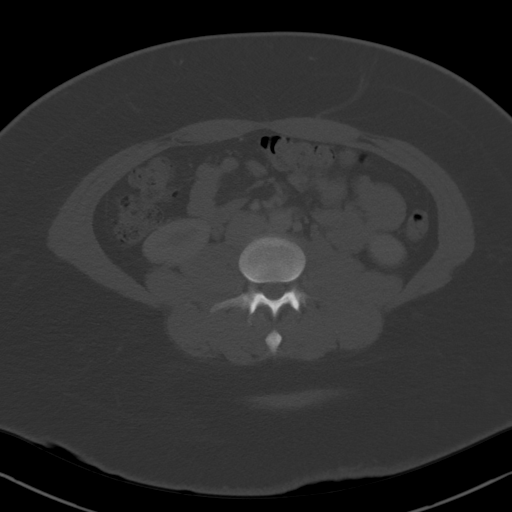
[im 63/95  soft-tissue]
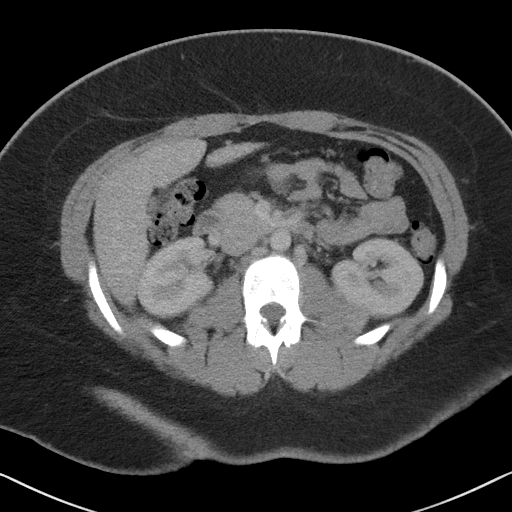
[im 71/95  soft-tissue]
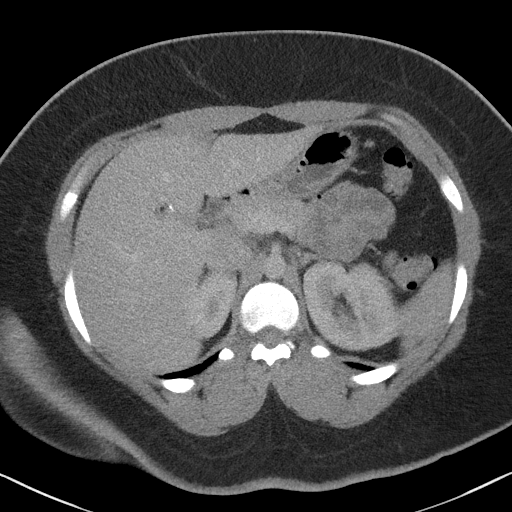
[im 75/95  soft-tissue]
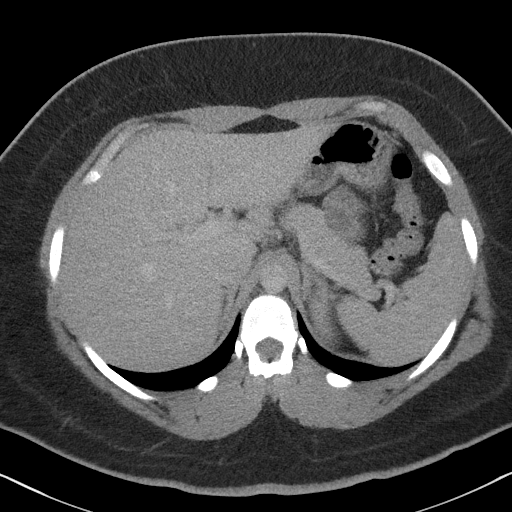
[im 83/95  soft-tissue]
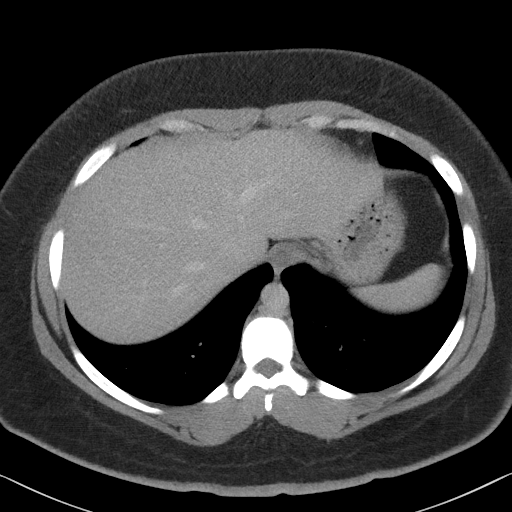
[im 91/95  soft-tissue]
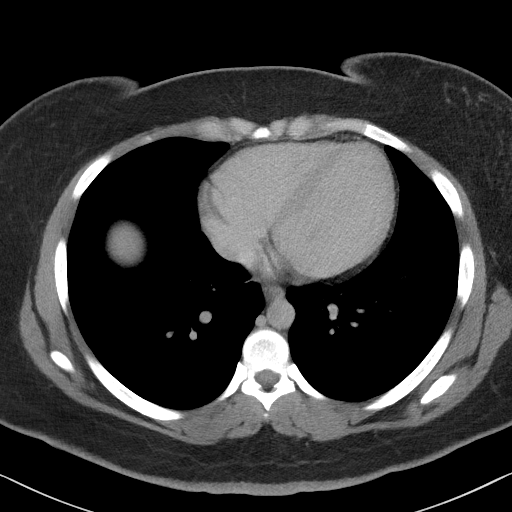

[Series 5: coronal st · coronal · 0.89mm/px · 3 of 90 slices shown]
[im 30/90  soft-tissue]
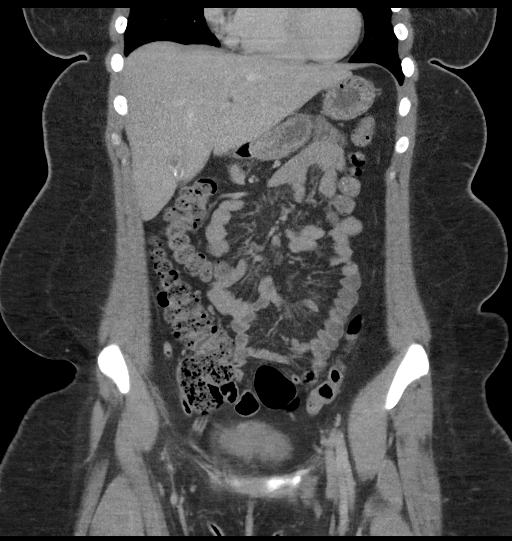
[im 40/90  soft-tissue]
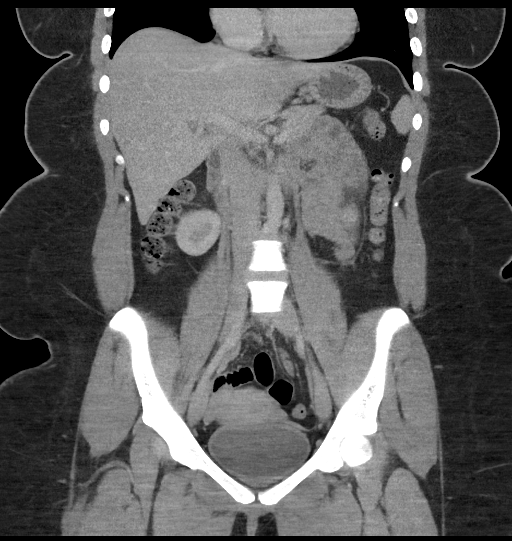
[im 50/90  soft-tissue]
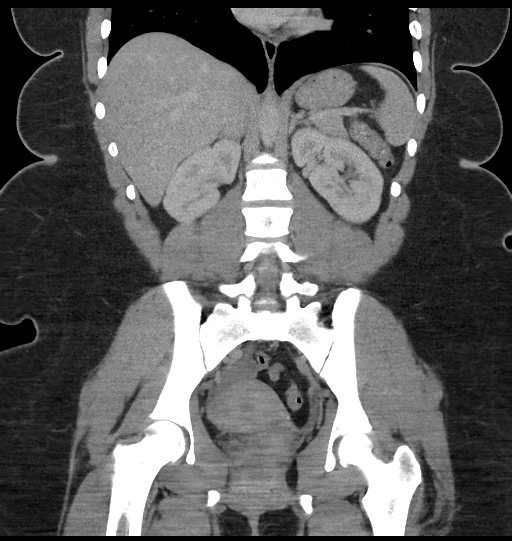

[17 of 46 positions shown; findings below may reference images not displayed]

FINDINGS: Lower chest: Lung bases are clear.

Hepatobiliary: Liver is within normal limits.

Contracted gallbladder with gallstones (coronal image 31), without
associated inflammatory changes. No intrahepatic or extrahepatic
ductal dilatation.

Pancreas: Within normal limits.

Spleen: Within normal limits.

Adrenals/Urinary Tract: Adrenal glands are within normal limits.

Kidneys are within normal limits.  No hydronephrosis.

Bladder is within normal limits.

Stomach/Bowel: Stomach is within normal limits.

No evidence of bowel obstruction.

Normal appendix (series 2/image 65).

No colonic wall thickening or inflammatory changes. Normal colonic
stool burden.

Vascular/Lymphatic: No evidence of abdominal aortic aneurysm.

No suspicious abdominopelvic lymphadenopathy.

Reproductive: Uterus is within normal limits.

Bilateral ovaries are within normal limits, including a dominant
cm right ovarian follicle, physiologic.

Other: No abdominopelvic ascites.

Musculoskeletal: Visualized osseous structures are within normal
limits.
IMPRESSION: Cholelithiasis, without associated inflammatory changes.

No evidence of bowel obstruction.  Normal appendix.

Normal colonic stool burden.
